# Patient Record
Sex: Male | Born: 2000 | Race: Black or African American | Hispanic: No | Marital: Single | Smoking: Never smoker
Health system: Southern US, Community
[De-identification: ages and names within clinical notes are randomized; demographics above are authoritative.]

---
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
## 2021-03-09 ENCOUNTER — Emergency Department (HOSPITAL_BASED_OUTPATIENT_CLINIC_OR_DEPARTMENT_OTHER): Payer: Medicaid Other

## 2021-03-09 ENCOUNTER — Encounter (HOSPITAL_BASED_OUTPATIENT_CLINIC_OR_DEPARTMENT_OTHER): Payer: Self-pay | Admitting: *Deleted

## 2021-03-09 ENCOUNTER — Emergency Department (HOSPITAL_BASED_OUTPATIENT_CLINIC_OR_DEPARTMENT_OTHER)
Admission: EM | Admit: 2021-03-09 | Discharge: 2021-03-09 | Disposition: A | Discharge status: Home / Self Care | Payer: Medicaid Other | Attending: Emergency Medicine | Admitting: Emergency Medicine

## 2021-03-09 ENCOUNTER — Other Ambulatory Visit: Payer: Self-pay

## 2021-03-09 DIAGNOSIS — R0781 Pleurodynia: Secondary | ICD-10-CM

## 2021-03-09 LAB — COMPREHENSIVE METABOLIC PANEL
ALT: 7 U/L (ref 0–44)
AST: 18 U/L (ref 15–41)
Albumin: 4.7 g/dL (ref 3.5–5.0)
Alkaline Phosphatase: 61 U/L (ref 38–126)
Anion gap: 6 (ref 5–15)
BUN: 14 mg/dL (ref 6–20)
CO2: 28 mmol/L (ref 22–32)
Calcium: 9.3 mg/dL (ref 8.9–10.3)
Chloride: 104 mmol/L (ref 98–111)
Creatinine, Ser: 0.93 mg/dL (ref 0.61–1.24)
GFR, Estimated: >60 mL/min (ref >60)
Glucose, Bld: 98 mg/dL (ref 70–99)
Potassium: 4.3 mmol/L (ref 3.5–5.1)
Sodium: 138 mmol/L (ref 135–145)
Total Bilirubin: 0.8 mg/dL (ref 0.3–1.2)
Total Protein: 7.6 g/dL (ref 6.5–8.1)

## 2021-03-09 LAB — CBC
HCT: 45.3 % (ref 39.0–52.0)
Hemoglobin: 15.1 g/dL (ref 13.0–17.0)
MCH: 31 pg (ref 26.0–34.0)
MCHC: 33.3 g/dL (ref 30.0–36.0)
MCV: 93 fL (ref 80.0–100.0)
Platelets: 216 10*3/uL (ref 150–400)
RBC: 4.87 MIL/uL (ref 4.22–5.81)
RDW: 12.4 % (ref 11.5–15.5)
WBC: 2.1 10*3/uL — ABNORMAL LOW (ref 4.0–10.5)
nRBC: 0 % (ref 0.0–0.2)

## 2021-03-09 LAB — URINALYSIS, ROUTINE W REFLEX MICROSCOPIC
Bilirubin Urine: NEGATIVE
Glucose, UA: NEGATIVE mg/dL
Hgb urine dipstick: NEGATIVE
Ketones, ur: NEGATIVE mg/dL
Nitrite: NEGATIVE
Protein, ur: NEGATIVE mg/dL
Specific Gravity, Urine: 1.025 (ref 1.005–1.030)
pH: 7 (ref 5.0–8.0)

## 2021-03-09 LAB — URINALYSIS, MICROSCOPIC (REFLEX)

## 2021-03-09 LAB — LIPASE, BLOOD: Lipase: 24 U/L (ref 11–51)

## 2021-03-09 MED ORDER — METHOCARBAMOL 1000 MG/10ML IJ SOLN
1000.0000 mg | Freq: Once | INTRAMUSCULAR | Status: DC
  Filled 2021-03-09: qty 10

## 2021-03-09 MED ORDER — KETOROLAC TROMETHAMINE 60 MG/2ML IM SOLN
30.0000 mg | Freq: Once | INTRAMUSCULAR | Status: AC
  Administered 2021-03-09: 21:00:00 30 mg via INTRAMUSCULAR
  Filled 2021-03-09: qty 2

## 2021-03-09 MED ORDER — METHOCARBAMOL 500 MG PO TABS
1000.0000 mg | ORAL_TABLET | Freq: Once | ORAL | Status: AC
  Administered 2021-03-09: 21:00:00 1000 mg via ORAL
  Filled 2021-03-09: qty 2

## 2021-03-09 MED ORDER — IBUPROFEN 600 MG PO TABS: 600.0000 mg | ORAL_TABLET | Freq: Four times a day (QID) | ORAL | 0 refills | Status: AC

## 2021-03-09 MED ORDER — METHOCARBAMOL 500 MG PO TABS: 1000.0000 mg | ORAL_TABLET | Freq: Two times a day (BID) | ORAL | 0 refills | Status: AC

## 2021-03-09 NOTE — ED Triage Notes (Signed)
Abdominal pain. Denies vomiting or diarrhea.

## 2021-03-09 NOTE — ED Provider Notes (Signed)
[facility] EMERGENCY DEPARTMENT Provider Note   CSN: [id number] Arrival date & time: 03/09/21  1654     History Chief Complaint  Patient presents with   Abdominal Pain    Edwin Stone is a 20 y.o. male.  HPI Patient is a healthy 20 year old male who presents for right lateral rib pain.  He states that he had a similar pain several months ago.  At that time, onset of the pain was associated with a "pop".  Previous pain resolved.  Earlier today, he had a recurrence of this pain with greater severity.  He denies any injuries associated with the new onset of pain.  Prior to onset, he has been in his normal state of health.  He has not taken anything for analgesia.  Pain is worsened with movements, palpation, and deep inspiration.  He denies any other areas of discomfort.  He has not had any recent cough, shortness of breath, or nausea.    History reviewed. No pertinent past medical history.  There are no problems to display for this patient.   History reviewed. No pertinent surgical history.     No family history on file.  Social History   Tobacco Use   Smoking status: Never   Smokeless tobacco: Never  Vaping Use   Vaping Use: Never used  Substance Use Topics   Alcohol use: Never   Drug use: Yes    Types: Marijuana    Home Medications Prior to Admission medications   Medication Sig Start Date End Date Taking? Authorizing Provider  ibuprofen (ADVIL) 600 MG tablet Take 1 tablet (600 mg total) by mouth 4 (four) times daily for 3 days. 03/09/21 03/12/21 Yes Godfrey Pick, MD  methocarbamol (ROBAXIN) 500 MG tablet Take 2 tablets (1,000 mg total) by mouth 2 (two) times daily for 3 days. 03/09/21 03/12/21 Yes Godfrey Pick, MD    Allergies    Patient has no known allergies.  Review of Systems   Review of Systems  Constitutional:  Negative for activity change, appetite change, chills, fatigue and fever.  HENT:  Negative for ear pain and sore throat.    Eyes:  Negative for pain and visual disturbance.  Respiratory:  Negative for cough, chest tightness and shortness of breath.   Cardiovascular:  Positive for chest pain (Right lateral ribs). Negative for palpitations and leg swelling.  Gastrointestinal:  Negative for abdominal pain, diarrhea, nausea and vomiting.  Genitourinary:  Negative for dysuria and hematuria.  Musculoskeletal:  Negative for arthralgias, back pain, myalgias and neck pain.  Skin:  Negative for color change and rash.  Allergic/Immunologic: Negative for immunocompromised state.  Neurological:  Negative for dizziness, seizures, syncope, weakness, light-headedness and numbness.  Hematological:  Does not bruise/bleed easily.  Psychiatric/Behavioral:  Negative for confusion and decreased concentration.   All other systems reviewed and are negative.  Physical Exam Updated Vital Signs BP 129/75    Pulse 71    Temp 98.4 F (36.9 C) (Oral)    Resp 18    Ht 5\' 7"  (1.702 m)    Wt 65.8 kg    SpO2 99%    BMI 22.71 kg/m   Physical Exam Vitals and nursing note reviewed.  Constitutional:      General: He is not in acute distress.    Appearance: He is well-developed and normal weight. He is not ill-appearing, toxic-appearing or diaphoretic.  HENT:     Head: Normocephalic and atraumatic.  Eyes:     General: No scleral icterus.  Conjunctiva/sclera: Conjunctivae normal.     Pupils: Pupils are equal, round, and reactive to light.  Cardiovascular:     Rate and Rhythm: Normal rate and regular rhythm.     Heart sounds: No murmur heard. Pulmonary:     Effort: Pulmonary effort is normal. No respiratory distress.     Breath sounds: Normal breath sounds. No stridor. No wheezing, rhonchi or rales.  Chest:     Chest wall: Tenderness present.  Abdominal:     Palpations: Abdomen is soft.     Tenderness: There is no abdominal tenderness. There is no right CVA tenderness, left CVA tenderness, guarding or rebound.  Musculoskeletal:         General: No swelling.     Cervical back: Neck supple.  Skin:    General: Skin is warm and dry.     Coloration: Skin is not jaundiced or pale.  Neurological:     General: No focal deficit present.     Mental Status: He is alert and oriented to person, place, and time.     Cranial Nerves: No cranial nerve deficit.     Motor: No weakness.  Psychiatric:        Mood and Affect: Mood normal.        Behavior: Behavior normal.    ED Results / Procedures / Treatments   Labs (all labs ordered are listed, but only abnormal results are displayed) Labs Reviewed  CBC - Abnormal; Notable for the following components:      Result Value   WBC 2.1 (*)    All other components within normal limits  URINALYSIS, ROUTINE W REFLEX MICROSCOPIC - Abnormal; Notable for the following components:   Leukocytes,Ua MODERATE (*)    All other components within normal limits  URINALYSIS, MICROSCOPIC (REFLEX) - Abnormal; Notable for the following components:   Bacteria, UA RARE (*)    All other components within normal limits  LIPASE, BLOOD  COMPREHENSIVE METABOLIC PANEL    EKG None  Radiology DG Ribs Unilateral W/Chest Right  Result Date: 03/09/2021 CLINICAL DATA:  Right posterior lower rib pain today after lifting injury. EXAM: RIGHT RIBS AND CHEST - 3+ VIEW COMPARISON:  None. FINDINGS: Heart size and pulmonary vascularity are normal. Lungs are clear. No pleural effusions. No pneumothorax. Mediastinal contours appear intact. Right ribs appear intact. No evidence of acute fracture or dislocation. No focal bone lesion or bone destruction. Soft tissues are unremarkable. IMPRESSION: No evidence of active pulmonary disease.  Negative right ribs. Electronically Signed   By: Burman Nieves M.D.   On: 03/09/2021 20:48    Procedures Procedures   Medications Ordered in ED Medications  ketorolac (TORADOL) injection 30 mg (30 mg Intramuscular Given 03/09/21 2045)  methocarbamol (ROBAXIN) tablet 1,000 mg (1,000  mg Oral Given 03/09/21 2044)    ED Course  I have reviewed the triage vital signs and the nursing notes.  Pertinent labs & imaging results that were available during my care of the patient were reviewed by me and considered in my medical decision making (see chart for details).    MDM Rules/Calculators/A&P                         Healthy 20 year old male presenting for right lateral rib pain.  Vital signs are normal upon arrival.  He is well-appearing on exam.  Breathing is even and unlabored.  He does have increased pain with deep inspiration as well as with palpation to the area.  Toradol and Robaxin were given.  Work-up, including labs and chest x-ray are reassuring.  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  On reexamination, patient has improved pain.  He was advised to continue treating the musculoskeletal pain with scheduled ibuprofen and Robaxin over the next several days.  He was encouraged to return to the ED if he experiences worsening symptoms.  He was discharged in good condition.   Final Clinical Impression(s) / ED Diagnoses Final diagnoses:  Rib pain on right side    Rx / DC Orders ED Discharge Orders          Ordered    ibuprofen (ADVIL) 600 MG tablet  4 times daily        03/09/21 2240    methocarbamol (ROBAXIN) 500 MG tablet  2 times daily        03/09/21 2240             Godfrey Pick, MD 03/11/21 [phone]

## 2023-01-30 ENCOUNTER — Other Ambulatory Visit: Payer: Self-pay

## 2023-01-30 ENCOUNTER — Emergency Department (HOSPITAL_BASED_OUTPATIENT_CLINIC_OR_DEPARTMENT_OTHER)
Admission: EM | Admit: 2023-01-30 | Discharge: 2023-01-30 | Disposition: A | Discharge status: Home / Self Care | Payer: Medicaid Other | Attending: Emergency Medicine | Admitting: Emergency Medicine

## 2023-01-30 ENCOUNTER — Encounter (HOSPITAL_BASED_OUTPATIENT_CLINIC_OR_DEPARTMENT_OTHER): Payer: Self-pay | Admitting: Emergency Medicine

## 2023-01-30 DIAGNOSIS — W01198A Fall on same level from slipping, tripping and stumbling with subsequent striking against other object, initial encounter: Secondary | ICD-10-CM | POA: Insufficient documentation

## 2023-01-30 DIAGNOSIS — S0993XA Unspecified injury of face, initial encounter: Secondary | ICD-10-CM | POA: Diagnosis present

## 2023-01-30 DIAGNOSIS — S0181XA Laceration without foreign body of other part of head, initial encounter: Secondary | ICD-10-CM | POA: Insufficient documentation

## 2023-01-30 MED ORDER — LIDOCAINE HCL (PF) 1 % IJ SOLN
10.0000 mL | Freq: Once | INTRAMUSCULAR | Status: AC
  Administered 2023-01-30: 10 mL
  Filled 2023-01-30: qty 10

## 2023-01-30 MED ORDER — LIDOCAINE-EPINEPHRINE-TETRACAINE (LET) TOPICAL GEL
3.0000 mL | Freq: Once | TOPICAL | Status: AC
  Administered 2023-01-30: 3 mL via TOPICAL
  Filled 2023-01-30: qty 3

## 2023-01-30 NOTE — ED Triage Notes (Signed)
Pt has laceration to just under RT eye by the corner of a garage door

## 2023-01-30 NOTE — Discharge Instructions (Addendum)
You have been seen today for your complaint of facial laceration. Your discharge medications include Alternate tylenol and ibuprofen for pain. You may alternate these every 4 hours. You may take up to 800 mg of ibuprofen at a time and up to 1000 mg of tylenol. Home care instructions are as follows:  Keep the area clean and dry for 24 hours.  Clean with warm soapy water once daily after this.  Do not submerge the wound until it is fully healed.  This will will likely take approximately 7 days to fully heal Follow up with: Your primary care provider in 1 week for reevaluation of your symptoms Please seek immediate medical care if you develop any of the following symptoms: You develop severe swelling around the wound. Your pain suddenly increases and is severe. You develop painful lumps near the wound or on skin anywhere else on your body. You have a red streak going away from your wound. The wound is on your hand or foot, and you cannot properly move a finger or toe. The wound is on your hand or foot, and you notice that your fingers or toes look pale or bluish. At this time there does not appear to be the presence of an emergent medical condition, however there is always the potential for conditions to change. Please read and follow the below instructions.  Do not take your medicine if  develop an itchy rash, swelling in your mouth or lips, or difficulty breathing; call 911 and seek immediate emergency medical attention if this occurs.  You may review your lab tests and imaging results in their entirety on your MyChart account.  Please discuss all results of fully with your primary care provider and other specialist at your follow-up visit.  Note: Portions of this text may have been transcribed using voice recognition software. Every effort was made to ensure accuracy; however, inadvertent computerized transcription errors may still be present.

## 2023-01-30 NOTE — ED Provider Notes (Signed)
[facility] Provider Note   CSN: [id number] Arrival date & time: 01/30/23  1536     History  Chief Complaint  Patient presents with   Facial Laceration    Edwin Stone is a 22 y.o. male.  Presenting for evaluation of a facial laceration.  He was at a storage units just prior to arrival. when he lifted the garage door a box fell into the door and then the door subsequently fell, striking the patient on the right side of the face.  He suffered a laceration just below the right eye.  He was also struck in the forehead.  He denies any pain or headaches at this time.  No vision changes or eye pain.  No pain with EOM.  HPI     Home Medications Prior to Admission medications   Not on File      Allergies    Patient has no known allergies.    Review of Systems   Review of Systems  Skin:  Positive for wound.  All other systems reviewed and are negative.   Physical Exam Updated Vital Signs BP 122/76 (BP Location: Right Arm)   Pulse 70   Temp 98.4 F (36.9 C) (Oral)   Resp (!) 22   Ht 5\' 11"  (1.803 m)   Wt 61.7 kg   SpO2 100%   BMI 18.97 kg/m  Physical Exam Vitals and nursing note reviewed.  Constitutional:      General: He is not in acute distress.    Appearance: Normal appearance. He is normal weight. He is not ill-appearing.  HENT:     Head: Normocephalic and atraumatic.      Comments: 4 cm linear laceration to the right maxilla, just inferior to the orbital rim.  Does not extend into the eyelid.  Does not involve the medial or lateral canthus or lacrimal duct. Pulmonary:     Effort: Pulmonary effort is normal. No respiratory distress.  Abdominal:     General: Abdomen is flat.  Musculoskeletal:        General: Normal range of motion.     Cervical back: Neck supple.  Skin:    General: Skin is warm and dry.  Neurological:     Mental Status: He is alert and oriented to person, place, and time.  Psychiatric:         Mood and Affect: Mood normal.        Behavior: Behavior normal.     ED Results / Procedures / Treatments   Labs (all labs ordered are listed, but only abnormal results are displayed) Labs Reviewed - No data to display  EKG None  Radiology No results found.  Procedures .Marland KitchenLaceration Repair  Date/Time: 01/30/2023 5:06 PM  Performed by: Michelle Piper, PA-C Authorized by: Michelle Piper, PA-C   Consent:    Consent obtained:  Verbal   Consent given by:  Patient   Risks discussed:  Infection, poor cosmetic result, poor wound healing and pain   Alternatives discussed:  No treatment Universal protocol:    Procedure explained and questions answered to patient or proxy's satisfaction: yes     Relevant documents present and verified: yes     Test results available: yes     Immediately prior to procedure, a time out was called: yes     Patient identity confirmed:  Verbally with patient and arm band Anesthesia:    Anesthesia method:  Topical application and local infiltration  Topical anesthetic:  LET   Local anesthetic:  Lidocaine 1% w/o epi Laceration details:    Location:  Face   Face location:  R cheek   Length (cm):  4 Exploration:    Hemostasis achieved with:  LET   Wound exploration: entire depth of wound visualized   Treatment:    Area cleansed with:  Povidone-iodine and Shur-Clens Skin repair:    Repair method:  Sutures   Suture size:  6-0   Suture material:  Fast-absorbing gut   Suture technique:  Simple interrupted   Number of sutures:  4 Approximation:    Approximation:  Close Repair type:    Repair type:  Simple Post-procedure details:    Dressing:  Adhesive bandage   Procedure completion:  Tolerated well, no immediate complications     Medications Ordered in ED Medications  lidocaine-EPINEPHrine-tetracaine (LET) topical gel (3 mLs Topical Given 01/30/23 1604)  lidocaine (PF) (XYLOCAINE) 1 % injection 10 mL (10 mLs Infiltration Given  01/30/23 1602)    ED Course/ Medical Decision Making/ A&P                                 Medical Decision Making This patient presents to the ED for concern of facial laceration, this involves an extensive number of treatment options  My initial workup includes wound closure  Additional history obtained from: Nursing notes from this visit.  Afebrile, hemodynamically stable.  22 year old male presenting for evaluation of facial laceration that occurred just prior to arrival.  Mother at bedside reports the patient received all of his childhood vaccinations.  He is up-to-date on his tetanus vaccine.  Wound was cleansed and closed.  Wound does not involve the eyelid, medial or lateral canthus, or tear ducts.  He was encouraged to follow-up with his primary care provider in 1 week for reevaluation of his wound.  He was given return precautions including signs symptoms of infection.  Stable at discharge.  At this time there does not appear to be any evidence of an acute emergency medical condition and the patient appears stable for discharge with appropriate outpatient follow up. Diagnosis was discussed with patient who verbalizes understanding of care plan and is agreeable to discharge. I have discussed return precautions with patient and mother who verbalizes understanding. Patient encouraged to follow-up with their PCP within 1 week. All questions answered.  Patient's case discussed with Dr. Karene Fry who agrees with plan to discharge with follow-up.   Note: Portions of this report may have been transcribed using voice recognition software. Every effort was made to ensure accuracy; however, inadvertent computerized transcription errors may still be present.        Final Clinical Impression(s) / ED Diagnoses Final diagnoses:  Facial laceration, initial encounter    Rx / DC Orders ED Discharge Orders     None         Michelle Piper, PA-C 01/30/23 1707    Ernie Avena,  MD 01/30/23 1737

## 2023-01-31 IMAGING — DX DG RIBS W/ CHEST 3+V*R*
4 series · 4 of 4 positions shown · non-contrast
Comparison: None.

CLINICAL DATA: Right posterior lower rib pain today after lifting
injury.

EXAM:
RIGHT RIBS AND CHEST - 3+ VIEW

[chest pa]
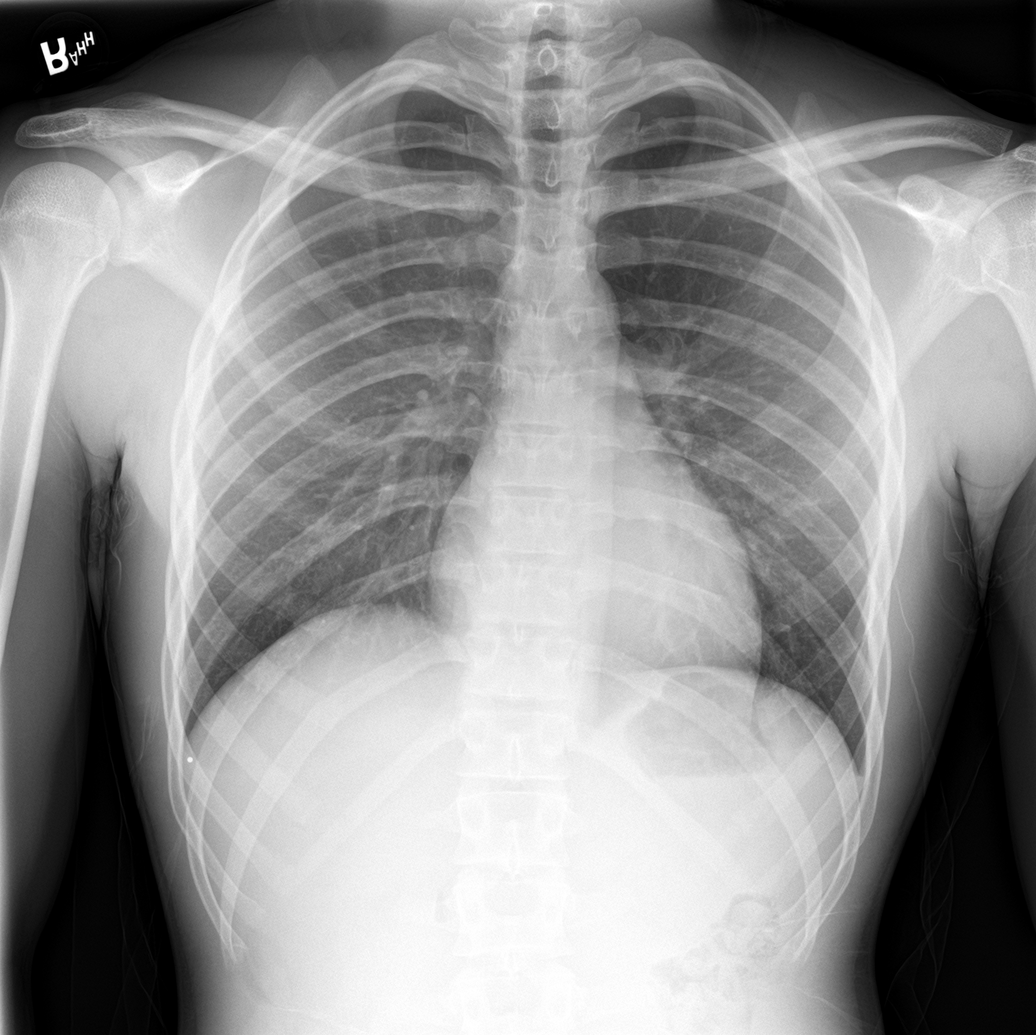

[rib pa]
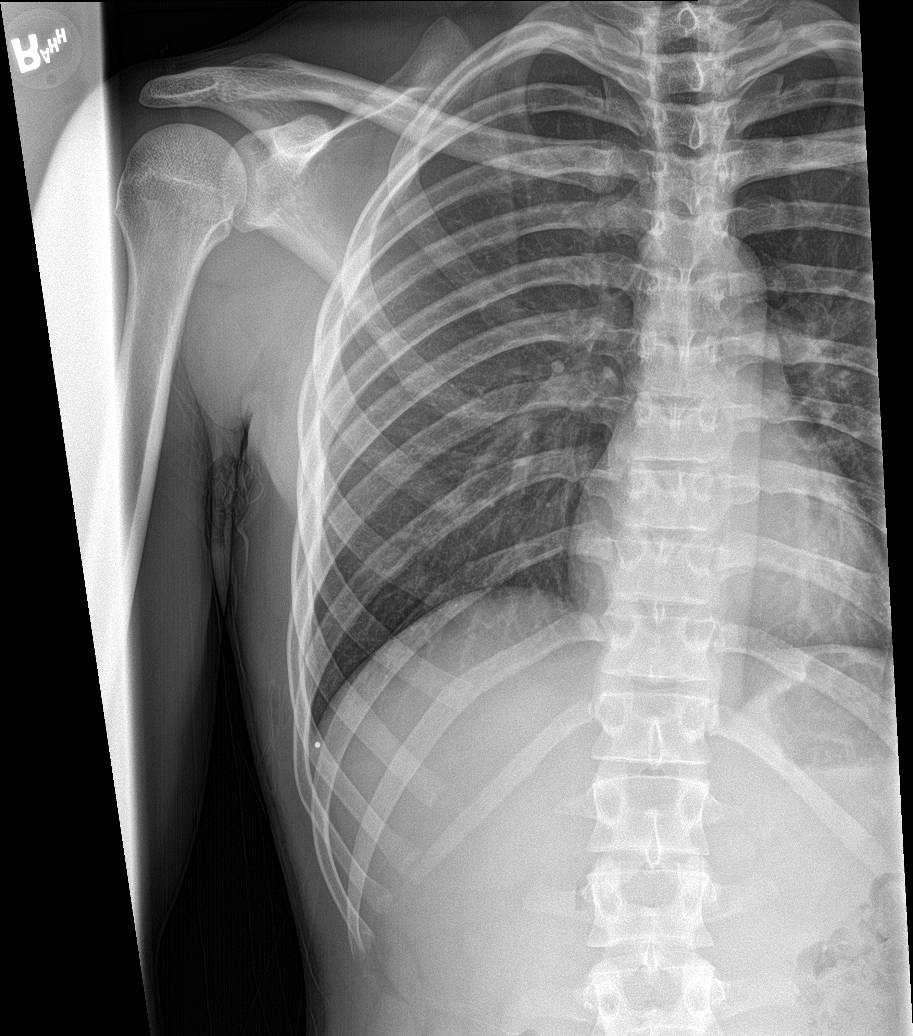

[rib pa obl]
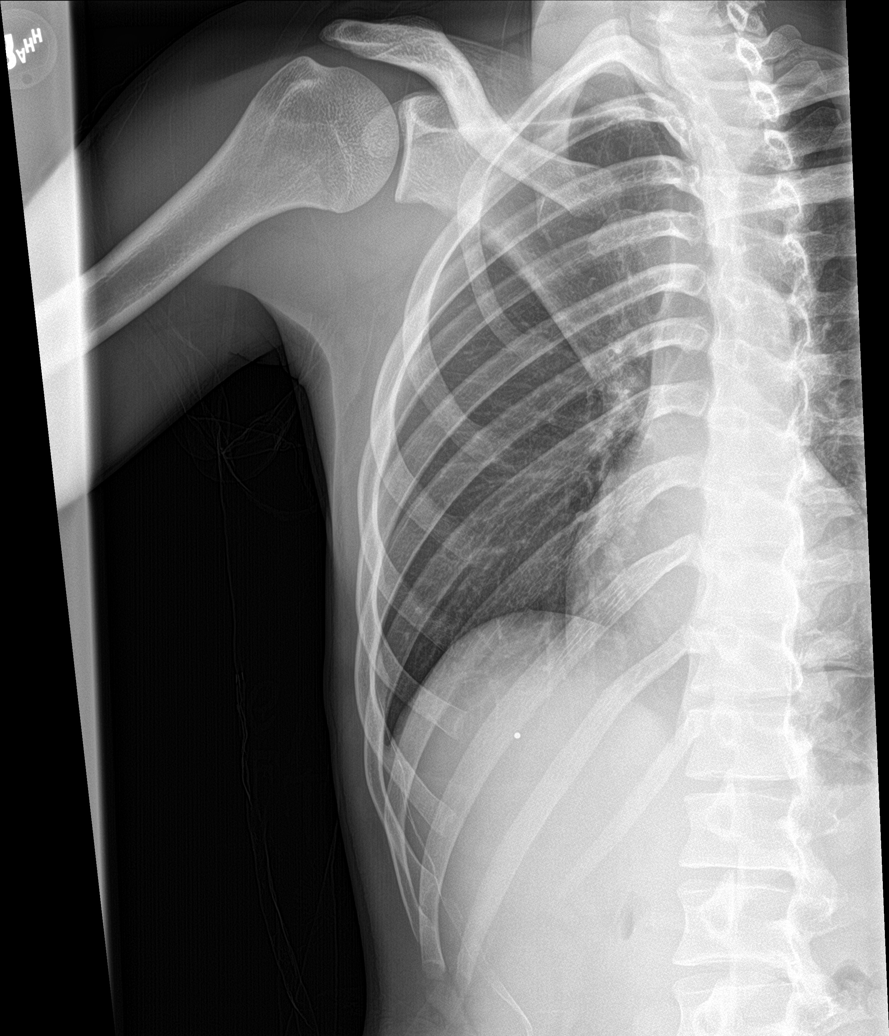

[rib ap]
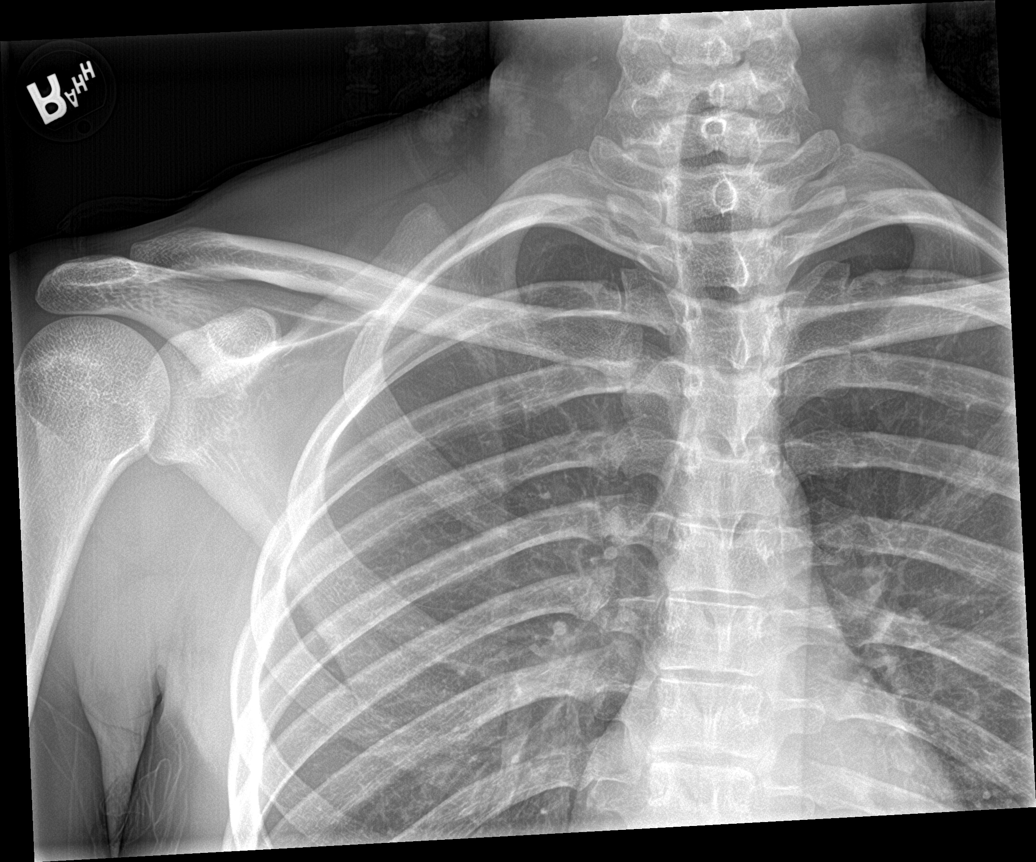

[4 of 4 positions shown; findings below may reference images not displayed]

FINDINGS: Heart size and pulmonary vascularity are normal. Lungs are clear. No
pleural effusions. No pneumothorax. Mediastinal contours appear
intact.

Right ribs appear intact. No evidence of acute fracture or
dislocation. No focal bone lesion or bone destruction. Soft tissues
are unremarkable.
IMPRESSION: No evidence of active pulmonary disease.  Negative right ribs.

## 2023-05-19 ENCOUNTER — Emergency Department (HOSPITAL_BASED_OUTPATIENT_CLINIC_OR_DEPARTMENT_OTHER)

## 2023-05-19 ENCOUNTER — Other Ambulatory Visit (HOSPITAL_BASED_OUTPATIENT_CLINIC_OR_DEPARTMENT_OTHER): Payer: Self-pay

## 2023-05-19 ENCOUNTER — Emergency Department (HOSPITAL_BASED_OUTPATIENT_CLINIC_OR_DEPARTMENT_OTHER)
Admission: EM | Admit: 2023-05-19 | Discharge: 2023-05-19 | Disposition: A | Discharge status: Home / Self Care | Attending: Emergency Medicine | Admitting: Emergency Medicine

## 2023-05-19 ENCOUNTER — Other Ambulatory Visit: Payer: Self-pay

## 2023-05-19 ENCOUNTER — Encounter (HOSPITAL_BASED_OUTPATIENT_CLINIC_OR_DEPARTMENT_OTHER): Payer: Self-pay

## 2023-05-19 DIAGNOSIS — N492 Inflammatory disorders of scrotum: Secondary | ICD-10-CM | POA: Diagnosis present

## 2023-05-19 LAB — URINALYSIS, ROUTINE W REFLEX MICROSCOPIC
Bilirubin Urine: NEGATIVE
Glucose, UA: NEGATIVE mg/dL
Hgb urine dipstick: NEGATIVE
Ketones, ur: NEGATIVE mg/dL
Leukocytes,Ua: NEGATIVE
Nitrite: NEGATIVE
Protein, ur: 30 mg/dL — AB
Specific Gravity, Urine: 1.02 (ref 1.005–1.030)
pH: 7 (ref 5.0–8.0)

## 2023-05-19 LAB — URINALYSIS, MICROSCOPIC (REFLEX)

## 2023-05-19 MED ORDER — DOXYCYCLINE HYCLATE 100 MG PO CAPS
100.0000 mg | ORAL_CAPSULE | Freq: Two times a day (BID) | ORAL | 0 refills | Status: AC
  Filled 2023-05-19: qty 20, 10d supply, fill #0

## 2023-05-19 NOTE — Discharge Instructions (Addendum)
 You were seen in the emergency department for groin swelling. On exam, you have a small abscess. The ultrasound confirmed this is very small.   Try to keep the area as clean and dry as possible. It is okay to let warm soapy water run over the area, but do NOT scrub the area.   I am placing you on a course of antibiotics. It is important you finish the entire course! You can take ibuprofen or tylenol as needed for pain.   You can use an antiseptic (chlorhexidine) soap from the pharmacy 1-2 x per month in the areas where abscesses are most likely to form (armpits, buttocks, groin). This soap can dry your skin out so use it sparingly. Once do so once this area has fully healed.   Continue to monitor how you're doing and return to the ER for new or worsening symptoms.

## 2023-05-19 NOTE — ED Triage Notes (Signed)
 Patient arrives POV with complaints of groin pain x3 days. Patient rates pain a 5/10.

## 2023-05-19 NOTE — ED Provider Notes (Signed)
[place] [facility] AT [facility] Provider Note   CSN: [id number] Arrival date & time: 05/19/23  4098     History  Chief Complaint  Patient presents with   Groin Pain    Edwin Stone is a 23 y.o. male with no reported PMH who presents to the ED complaining of right sided scrotal pain and "lump". Noticed it first about 2 days ago and pain is worsening. Feels a lump on the right side, between the R scrotum and his right upper thigh. Pain radiates up the scrotum a bit. No penile lesions, discharge, or urinary sx.    Groin Pain       Home Medications Prior to Admission medications   Medication Sig Start Date End Date Taking? Authorizing Provider  doxycycline (VIBRAMYCIN) 100 MG capsule Take 1 capsule (100 mg total) by mouth 2 (two) times daily. 05/19/23  Yes Rykar Lebleu T, PA-C      Allergies    Patient has no known allergies.    Review of Systems   Review of Systems  Genitourinary:  Positive for scrotal swelling and testicular pain.  All other systems reviewed and are negative.   Physical Exam Updated Vital Signs BP 127/71 (BP Location: Right Arm)   Pulse 71   Temp 98.8 F (37.1 C) (Oral)   Resp 18   Ht 5\' 11"  (1.803 m)   Wt 59 kg   SpO2 100%   BMI 18.13 kg/m  Physical Exam Vitals and nursing note reviewed.  Constitutional:      Appearance: Normal appearance.  HENT:     Head: Normocephalic and atraumatic.  Eyes:     Conjunctiva/sclera: Conjunctivae normal.  Pulmonary:     Effort: Pulmonary effort is normal. No respiratory distress.  Abdominal:     Hernia: There is no hernia in the right inguinal area.  Genitourinary:    Testes: Normal.     Comments: Induration around 2 cm to the lateral right scrotum with erythema, small amount of purulent drainage Skin:    General: Skin is warm and dry.  Neurological:     Mental Status: He is alert.  Psychiatric:        Mood and Affect: Mood normal.        Behavior: Behavior  normal.     ED Results / Procedures / Treatments   Labs (all labs ordered are listed, but only abnormal results are displayed) Labs Reviewed  URINALYSIS, ROUTINE W REFLEX MICROSCOPIC - Abnormal; Notable for the following components:      Result Value   Protein, ur 30 (*)    All other components within normal limits  URINALYSIS, MICROSCOPIC (REFLEX) - Abnormal; Notable for the following components:   Bacteria, UA RARE (*)    All other components within normal limits  GC/CHLAMYDIA PROBE AMP () NOT AT [facility]    EKG None  Radiology US SCROTUM W/DOPPLER Result Date: 05/19/2023 CLINICAL DATA:  Scrotal swelling EXAM: SCROTAL ULTRASOUND DOPPLER ULTRASOUND OF THE TESTICLES TECHNIQUE: Complete ultrasound examination of the testicles, epididymis, and other scrotal structures was performed. Color and spectral Doppler ultrasound were also utilized to evaluate blood flow to the testicles. COMPARISON:  None Available. FINDINGS: Right testicle Measurements: 4.6 x 2 x 3.3 cm. No mass or microlithiasis visualized. Left testicle Measurements: 4.5 x 2.4 x 3.3 cm. No mass or microlithiasis visualized. Right epididymis:  Normal in size and appearance. Left epididymis:  Normal in size and appearance. Hydrocele:  None visualized. Varicocele:  None visualized.  Pulsed Doppler interrogation of both testes demonstrates normal low resistance arterial and venous waveforms bilaterally. Targeted ultrasound of palpable mass along lateral aspect of testis is performed. Heterogenous avascular mass measuring 1.7 by 0.8 x 1.7 cm, appears slightly serpiginous in configuration with areas of increased echogenicity. IMPRESSION: 1. Negative for testicular torsion or intratesticular mass 2. 1.7 cm complex avascular mass in the right lateral testis corresponding to palpable mass, this is nonspecific in appearance. Mass could be inflammatory. Slightly serpiginous configuration with areas of increased echogenicity, other  consideration could include focally thrombosed pampiniform vessel. Short-term sonographic follow-up is recommended Electronically Signed   By: Jasmine Pang M.D.   On: 05/19/2023 15:25    Procedures Procedures    Medications Ordered in ED Medications - No data to display  ED Course/ Medical Decision Making/ A&P                                 Medical Decision Making Amount and/or Complexity of Data Reviewed Labs: ordered. Radiology: ordered.  Risk Prescription drug management.   This patient is a 23 y.o. male  who presents to the ED for concern of groin pain, scrotal swelling x 2-3 days.   Differential diagnoses prior to evaluation: The emergent differential diagnosis includes, but is not limited to,  STD, lesions, epididymitis, testicular torsion, scrotal abscess, kidney stone, hernia. This is not an exhaustive differential.   Past Medical History / Co-morbidities / Social History: No reported PMH  Physical Exam: Physical exam performed. The pertinent findings include: Normal vitals, no distress. Right lateral scrotum with about 2-3 cm of induration and overlying erythema, very small amount of purulent drainage when expressed. Testicle nontender, no hernia.  Lab Tests/Imaging studies: I personally interpreted labs/imaging and the pertinent results include:  UA with rare bacteria, some protein.   Scrotal US with no evidence of testicular torsion or intratesticular mass, however 1.7 cm complex avascular mass in the right lateral testis corresponding to palpable mass, nonspecific in appearance.  With findings on exam correlated with ultrasound, suspect that this is likely a small abscess.  Disposition: After consideration of the diagnostic results and the patients response to treatment, I feel that emergency department workup does not suggest an emergent condition requiring admission or immediate intervention beyond what has been performed at this time. The plan is: Discharge to  home.  Suspect scrotal swelling in the setting of small abscess, likely from an ingrown hair.  Patient does have active drainage of pus on exam, do not feel we need to further incise and drain it.  Will place him on a course of doxycycline and recommend follow-up.  Also given instructions for good wound care.. The patient is safe for discharge and has been instructed to return immediately for worsening symptoms, change in symptoms or any other concerns.  Final Clinical Impression(s) / ED Diagnoses Final diagnoses:  Scrotal wall abscess    Rx / DC Orders ED Discharge Orders          Ordered    doxycycline (VIBRAMYCIN) 100 MG capsule  2 times daily        05/19/23 1543           Portions of this report may have been transcribed using voice recognition software. Every effort was made to ensure accuracy; however, inadvertent computerized transcription errors may be present.    Jeanella Flattery 05/19/23 1707    Pricilla Loveless, MD 05/24/23  1607  

## 2023-05-19 NOTE — ED Notes (Signed)
 Patient given discharge instructions. Questions were answered. Patient verbalized understanding of discharge instructions and care at home.

## 2023-05-20 LAB — GC/CHLAMYDIA PROBE AMP (~~LOC~~) NOT AT ARMC
Chlamydia: POSITIVE — AB
Comment: NEGATIVE
Comment: NORMAL
Neisseria Gonorrhea: NEGATIVE
